# Patient Record
Sex: Female | Born: 1955 | Race: Black or African American | Hispanic: No | Marital: Married
Health system: Southern US, Community
[De-identification: ages and names within clinical notes are randomized; demographics above are authoritative.]

---

## 2005-08-21 ENCOUNTER — Ambulatory Visit: Payer: Self-pay | Admitting: Internal Medicine

## 2007-04-20 ENCOUNTER — Ambulatory Visit: Payer: Self-pay | Admitting: Internal Medicine

## 2007-12-09 ENCOUNTER — Other Ambulatory Visit: Payer: Self-pay

## 2007-12-09 ENCOUNTER — Inpatient Hospital Stay: Payer: Self-pay | Admitting: Internal Medicine

## 2009-09-07 IMAGING — CR DG CHEST 2V
1 series · 2 of 2 positions shown · non-contrast
Comparison: none

REASON FOR EXAM: shob -ed 12
COMMENTS:

[Series 1: view not recorded · 0.17mm/px · 2 of 2 slices shown]
[im 1/2]
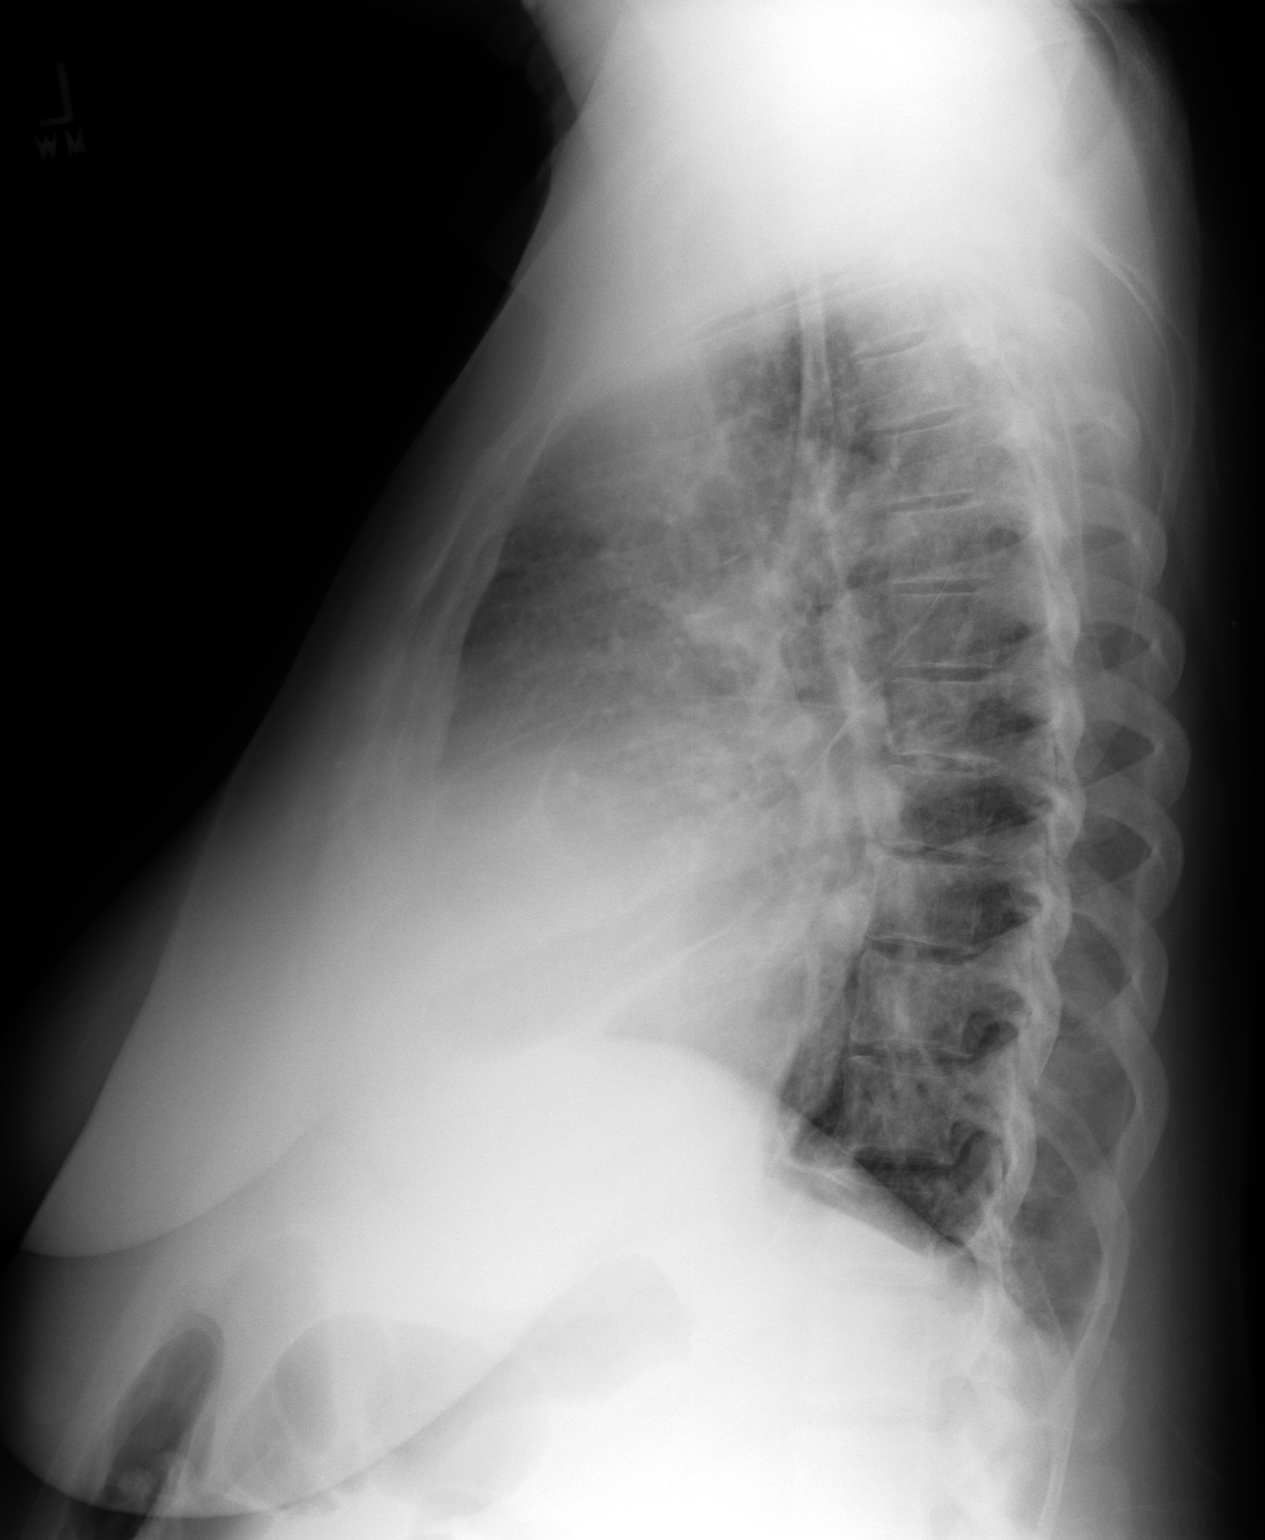
[im 2/2]
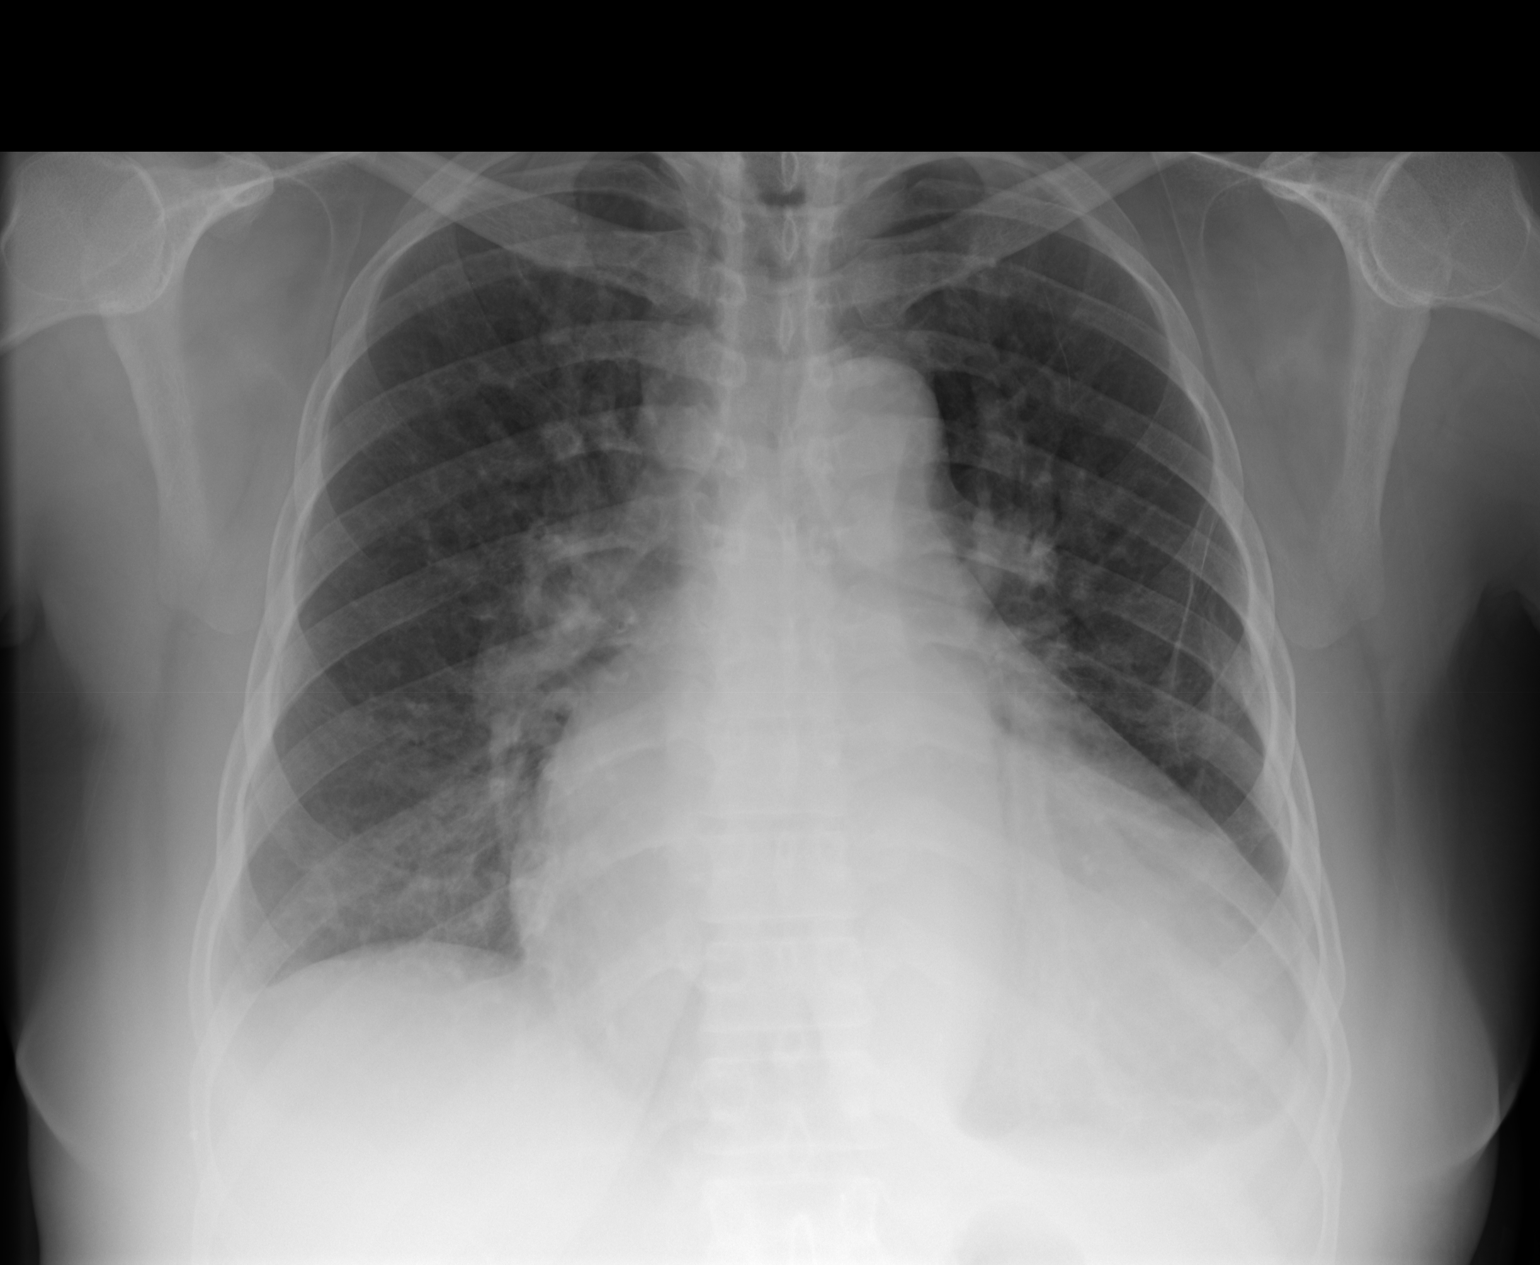

[2 of 2 positions shown; findings below may reference images not displayed]

PROCEDURE:     DXR - DXR CHEST PA (OR AP) AND LATERAL  - December 09, 2007  [DATE]

RESULT:     There is no previous exam for comparison. The cardiac silhouette
is enlarged. There is pulmonary vascular congestion. There may be some
minimal interstitial edema. There is no significant effusion. There is no
pneumothorax or mass.
IMPRESSION: Fairly significant cardiomegaly with pulmonary vascular congestion and
possibly mild edema.

## 2011-12-09 ENCOUNTER — Ambulatory Visit: Payer: Self-pay | Admitting: Internal Medicine

## 2017-10-14 ENCOUNTER — Emergency Department
Admission: EM | Admit: 2017-10-14 | Discharge: 2017-11-13 | Disposition: E | Payer: Managed Care, Other (non HMO) | Attending: Emergency Medicine | Admitting: Emergency Medicine

## 2017-10-14 DIAGNOSIS — I469 Cardiac arrest, cause unspecified: Secondary | ICD-10-CM | POA: Insufficient documentation

## 2017-10-14 DIAGNOSIS — E039 Hypothyroidism, unspecified: Secondary | ICD-10-CM | POA: Diagnosis not present

## 2017-10-14 LAB — GLUCOSE, CAPILLARY: Glucose-Capillary: 50 mg/dL — ABNORMAL LOW (ref 65–99)

## 2017-10-14 MED ORDER — EPINEPHRINE PF 1 MG/10ML IJ SOSY
PREFILLED_SYRINGE | INTRAMUSCULAR | Status: AC | PRN
  Administered 2017-10-14 (×4): 1 mg via INTRAVENOUS

## 2017-10-14 MED ORDER — DEXTROSE 50 % IV SOLN
INTRAVENOUS | Status: AC | PRN
  Administered 2017-10-14: 1 via INTRAVENOUS

## 2017-10-14 MED ORDER — CALCIUM CHLORIDE 10 % IV SOLN
INTRAVENOUS | Status: AC | PRN
  Administered 2017-10-14: 1 g via INTRAVENOUS

## 2017-10-15 MED FILL — Medication: Qty: 1 | Status: AC

## 2017-11-13 NOTE — Code Documentation (Signed)
2G magnesium via IO administered 1643

## 2017-11-13 NOTE — Code Documentation (Signed)
Bicarb 1646

## 2017-11-13 NOTE — Code Documentation (Signed)
Pulse check, v fib, shock administered, CPR resumed.

## 2017-11-13 NOTE — Code Documentation (Signed)
IO to left knee by Dr. Don PerkingVeronese

## 2017-11-13 NOTE — Progress Notes (Signed)
Pharmacist responded to CPR in process, assisted in getting [facility] compression device to nursing/physican staff. Handed RN some medications used in code.  Christy Juarez, PharmD, MBA, Liz ClaiborneBCGP Clinical Pharmacist [facility]

## 2017-11-13 NOTE — Code Documentation (Signed)
Bicarb given

## 2017-11-13 NOTE — ED Provider Notes (Signed)
[facility] Emergency Department Provider Note  ____________________________________________  Time seen: Approximately 5:09 PM  I have reviewed the triage vital signs and the nursing notes.   HISTORY  Chief Complaint No chief complaint on file.  Level 5 caveat:  Portions of the history and physical were unable to be obtained due to cardiac arrest   HPI Christy Juarez is a 62 y.o. female with unknown past medical history who presents with CPR ongoing for cardiac arrest. According to EMS patient was supposed to show up for her shift at work at [employer]. She was found outside the building and is smoking wooded area pulseless at 3:30 PM. Initial rhythm was V. fib arrest. Patient was shocked 3 times a several rounds of CPR. She received a Jettie PaganKing airwayand was transported to the hospital. Patient arrived with CPR ongoing, pulseless, King airway in place.   No past medical history on file.  There are no active problems to display for this patient.   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  FH Myocardial Infarction (Heart attack) Brother  MI  Heart disease Mother  CHF  Heart failure Mother     Relation Name Status Comments  Brother  Deceased (Age 61) MI  Mother  Deceased     Social History Social History   Tobacco Use  . Smoking status: Not on file  Substance Use Topics  . Alcohol use: Not on file  . Drug use: Not on file    Review of Systems Cardiovascular: + cardiac arrest  Level 5 caveat:  Portions of the history and  physical were unable to be obtained due to cardiac arrest  ____________________________________________   PHYSICAL EXAM:  VITAL SIGNS: HR 0 BP none  Constitutional: GCS 3T HEENT:      Head: Normocephalic and atraumatic.         Eyes: Pupils fixed and dilated      Mouth/Throat: King airway on place Cardiovascular: Pulseless Respiratory: Mechanical breath sounds Gastrointestinal: Soft, non distended  Musculoskeletal: No edema, cyanosis, or erythema of extremities. IO in the R tibia Neurologic: GCS 3T Skin: Skin is warm, dry and intact. No rash noted.  ____________________________________________   LABS (all labs ordered are listed, but only abnormal results are displayed)  Labs Reviewed - No data to display ____________________________________________  EKG  none  ____________________________________________  RADIOLOGY  none  ____________________________________________   PROCEDURES  Procedure(s) performed: None Procedures Critical Care performed: yes  CRITICAL CARE Performed by: Nita Sicklearolina Madeeha Costantino  ?  Total critical care time: 40 min  Critical care time was exclusive of separately billable procedures and treating other patients.  Critical care was necessary to treat or prevent imminent or life-threatening deterioration.  Critical care was time spent personally by me on the following activities: development of treatment plan with patient and/or surrogate as well as nursing, discussions with consultants, evaluation of patient's response to treatment, examination of patient, obtaining history from patient or surrogate, ordering and performing treatments and interventions, ordering and review of laboratory studies, ordering and review of radiographic studies, pulse oximetry and re-evaluation of  patient's condition.  ____________________________________________   INITIAL IMPRESSION / ASSESSMENT AND PLAN / ED COURSE   62 y.o. female with unknown past medical  history who presents with CPR ongoing for cardiac arrest. Patient was found down for unknown period of time. Supposed to show up for work at 3 PM and was found down at 3:30 PM outside the building, initial rhythm was V. fib arrest. Patient received 3 rounds of shock and CPR with epi. Rhythm than change to PEA arrest. At that time I received a phone call from EMS requesting to stop resuscitative measures. I recommended one more round of epi and after that patient's end-tidal CO2 increased which prompted EMS to bring patient to the emergency department. Patient arrives pulseless, with a King airway in place, IO in the R tibia, GCS of 3T. Second IO in the L tibia was placed. Several rounds of CPR per ACLS protocol with epi, bicarbonate, magnesium, calcium were administered. Patient's fingerstick was 50 and she was given an amp of D50. Her initial rhythm in the emergency department was PEA arrest and after several rounds of CPR the rhythm changed to V. fib for which patient received 2 shocks unsuccessfully. After 15 minutes of CPR patient's husband was brought back to the room and at that time he decided to stop all resuscitative measures. Patient was pronounced dead at 4:55 PM. I discussed the case with the medical examiner and patient will be sent downstairs to the morgue. Patient's husband is trying to contact the rest of the family.      As part of my medical decision making, I reviewed the following data within the electronic MEDICAL RECORD NUMBER History obtained from family, Nursing notes reviewed and incorporated, Old chart reviewed, Notes from prior ED visits and [place] Controlled Substance Database.    Pertinent labs & imaging results that were available during my care of the patient were reviewed by me and considered in my medical decision making (see chart for details).    ____________________________________________   FINAL CLINICAL IMPRESSION(S) / ED DIAGNOSES  Final diagnoses:  Cardiac arrest  (HCC)      NEW MEDICATIONS STARTED DURING THIS VISIT:  ED Discharge Orders    None       Note:  This document was prepared using Dragon voice recognition software and may include unintentional dictation errors.    Don Perking, [place], MD 11-08-17 [phone]

## 2017-11-13 NOTE — Code Documentation (Addendum)
Pt arrives to ER. CPR continues via [place] monitor from EMS.   Pt received 5 doses of epinephrine, 300mg  amiodarone, 2mg  narcan with EMS via IO to right knee. Pt had king airway in place at time of arrival.

## 2017-11-13 NOTE — ED Notes (Signed)
Report given to Kailey, RN.  

## 2017-11-13 NOTE — Code Documentation (Addendum)
Husband at bedside, stating "that is it". CPR stopped via verbal order Dr. Don PerkingVeronese. Time of death [id number] called by Dr.Veronese.

## 2017-11-13 NOTE — Code Documentation (Signed)
CBG 50 

## 2017-11-13 NOTE — Code Documentation (Signed)
vfib on monitor, shock given, CPR resumed.

## 2017-11-13 NOTE — Therapy (Signed)
Bagged patient with 100% oxygen throughout code.

## 2017-11-13 NOTE — ED Notes (Signed)
[facility] paged to report to ED for post CPR. Family not present.  CH available if needed

## 2017-11-13 NOTE — Code Documentation (Signed)
Pulse check, PEA, CPR continued via staff.

## 2017-11-13 NOTE — ED Notes (Addendum)
[place] [facility] contacted; spoke with Erasmo Scoreatrick Small case number [id number]. Hold for eyes only.

## 2017-11-13 NOTE — Code Documentation (Signed)
Pulse check; asystole on monitor. CPR continues

## 2017-11-13 NOTE — ED Notes (Signed)
Eyes prepped by Clydie BraunKaren, RN. Medical Examiner has been notified. Husband to go get daughters and bring back to see patient.

## 2017-11-13 NOTE — Code Documentation (Signed)
Vfib on monitor.

## 2017-11-13 DEATH — deceased
